# Patient Record
Sex: Female | Born: 1975 | Race: White | Marital: Married | Smoking: Current every day smoker
Health system: Southern US, Community
[De-identification: ages and names within clinical notes are randomized; demographics above are authoritative.]

## PROBLEM LIST (undated history)

## (undated) DIAGNOSIS — G43909 Migraine, unspecified, not intractable, without status migrainosus: Secondary | ICD-10-CM

## (undated) HISTORY — DX: Migraine, unspecified, not intractable, without status migrainosus: G43.909

## (undated) HISTORY — PX: APPENDECTOMY: SHX54

## (undated) HISTORY — PX: OTHER SURGICAL HISTORY: SHX169

---

## 2017-11-01 HISTORY — PX: HYSTEROSCOPY WITH D & C: SHX1775

## 2018-07-24 ENCOUNTER — Encounter: Payer: Self-pay | Admitting: *Deleted

## 2018-08-12 ENCOUNTER — Encounter: Payer: Self-pay | Admitting: Advanced Practice Midwife

## 2018-08-12 NOTE — Progress Notes (Deleted)
Subjective:     Betty Farrell is a 42 y.o. female here for a routine exam.  Current complaints: ***.  Personal health questionnaire reviewed: {yes/no:9010}.   Gynecologic History No LMP recorded. Contraception: {method:5051} Last Pap: ***. Results were: {norm/abn:16337} Last mammogram: ***. Results were: {norm/abn:16337}  Obstetric History OB History  No data available     {Common ambulatory SmartLinks:19316}  Review of Systems {ros; complete:30496}    Objective:    {exam; complete:18323}    Assessment:    Healthy female exam.    Plan:    {plan:19193}

## 2018-10-23 ENCOUNTER — Ambulatory Visit (INDEPENDENT_AMBULATORY_CARE_PROVIDER_SITE_OTHER): Payer: Medicaid Other | Admitting: Obstetrics & Gynecology

## 2018-10-23 ENCOUNTER — Encounter: Payer: Self-pay | Admitting: Obstetrics & Gynecology

## 2018-10-23 VITALS — BP 109/72 | HR 75 | Ht 59.0 in | Wt 127.0 lb

## 2018-10-23 DIAGNOSIS — Z1239 Encounter for other screening for malignant neoplasm of breast: Secondary | ICD-10-CM

## 2018-10-23 DIAGNOSIS — Z30430 Encounter for insertion of intrauterine contraceptive device: Secondary | ICD-10-CM

## 2018-10-23 DIAGNOSIS — Z3009 Encounter for other general counseling and advice on contraception: Secondary | ICD-10-CM

## 2018-10-23 MED ORDER — LEVONORGESTREL 20 MCG/24HR IU IUD
INTRAUTERINE_SYSTEM | Freq: Once | INTRAUTERINE | Status: AC
  Administered 2018-10-23: 1 via INTRAUTERINE

## 2018-10-23 NOTE — Progress Notes (Signed)
GYNECOLOGY CLINIC PROCEDURE NOTE  Betty Farrell is a 43 y.o. G4P4 here with desire for permanent contraception She has been in the [place] for 3 years. She reports that an attempt was made to do a BTL but, failed due to scar tissue. She had bowel surgery years ago due to  some issue with her bowels. She reports that an attempt was made to place an IUD with no success- it fell out in the room before she left 3 times. She does not know who was attempting to place the IUD.     After full discussion of her prior surgeries and review of the risks of the procedure and her alternative the decision was made to proceed with an attempt at LnIUD placement.   IUD Insertion Procedure Note Patient identified, informed consent performed.  Discussed risks of irregular bleeding, cramping, infection, malpositioning or misplacement of the IUD outside the uterus which may require further procedures. Time out was performed.  Urine pregnancy test negative.  Speculum placed in the vagina.  Cervix visualized.  Cleaned with Betadine x 2.  Grasped anteriorly with a single tooth tenaculum.  Uterus sounded to 9 cm.  Mirena IUD placed per manufacturer's recommendations.  Strings trimmed to 3 cm. Tenaculum was removed, good hemostasis noted.  Patient tolerated procedure well.   Patient was given post-procedure instructions.  Patient was asked to follow up in 4 weeks for IUD check and Annual with PAP  In person interpreter was used to the entire visit.   Total face-to-face time with patient was 30 min.  Greater than 50% was spent in counseling and coordination of care with the patient. The visit was prolonged due to language barrier and extended history. Records in [place].  Aadon Gorelik L. Harraway-Smith, M.D., Evern Core

## 2018-10-23 NOTE — Progress Notes (Signed)
[id number] [place]  interpreter used and then live interpreter arrived.  Patient is interested in tubal ligation, but patient has had surgery before on her bowels (was told she couldn't get tubes tied because of abdominal surgery). Armandina Stammer RN

## 2018-10-24 ENCOUNTER — Encounter: Payer: Self-pay | Admitting: Obstetrics & Gynecology

## 2018-11-21 ENCOUNTER — Ambulatory Visit: Payer: Medicaid Other | Admitting: Obstetrics & Gynecology

## 2019-02-19 ENCOUNTER — Ambulatory Visit: Payer: Medicaid Other | Admitting: Family Medicine

## 2019-03-27 ENCOUNTER — Other Ambulatory Visit (HOSPITAL_COMMUNITY)
Admission: RE | Admit: 2019-03-27 | Discharge: 2019-03-27 | Disposition: A | Discharge status: Home / Self Care | Payer: Medicaid Other | Source: Ambulatory Visit | Attending: Obstetrics and Gynecology | Admitting: Obstetrics and Gynecology

## 2019-03-27 ENCOUNTER — Encounter: Payer: Self-pay | Admitting: Obstetrics and Gynecology

## 2019-03-27 ENCOUNTER — Other Ambulatory Visit: Payer: Self-pay

## 2019-03-27 ENCOUNTER — Ambulatory Visit (INDEPENDENT_AMBULATORY_CARE_PROVIDER_SITE_OTHER): Payer: Medicaid Other | Admitting: Obstetrics and Gynecology

## 2019-03-27 VITALS — BP 111/70 | HR 78 | Wt 130.0 lb

## 2019-03-27 DIAGNOSIS — Z789 Other specified health status: Secondary | ICD-10-CM

## 2019-03-27 DIAGNOSIS — Z124 Encounter for screening for malignant neoplasm of cervix: Secondary | ICD-10-CM | POA: Insufficient documentation

## 2019-03-27 DIAGNOSIS — Z3009 Encounter for other general counseling and advice on contraception: Secondary | ICD-10-CM

## 2019-03-27 DIAGNOSIS — Z30432 Encounter for removal of intrauterine contraceptive device: Secondary | ICD-10-CM

## 2019-03-27 NOTE — Progress Notes (Signed)
   IUD Removal Procedure Note   Patient is 43 y.o. G4P4 who is here for Paraguard IUD removal. She would like IUD removed secondary to pain. She understands that she could get pregnant after removal of IUD if she does not use another form of contraception. She has no other complaints today. Reviewed risks of removal including pain, bleeding, difficult removal and inability to remove IUD which may require hysteroscopic removal in OR. She affirms that she would like IUD removed.  BP 111/70   Pulse 78   Wt 130 lb (59 kg)   BMI 26.26 kg/m   Patient with normal appearing external female genitalia. Graves speculum placed in vagina and Paraguard IUD strings easily visualized. Strings grasped with ring forceps and removed easily. Minimal bleeding noted. All instruments removed from vagina. Patient tolerated procedure very well.    She was given post removal instructions. She is planning on using condoms for contraception.   Return 1 year for annual or prn.    Feliz Beam, M.D. Attending Center for [employer] farm manager)

## 2019-03-27 NOTE — Progress Notes (Signed)
   GYNECOLOGY OFFICE FOLLOW UP NOTE  History:  43 y.o. G4P4 here today for follow up for IUD removal. States she has had back pain since it was put in. Also states she just doesn't feel right. Patient will use condoms for contraception. Periods have lighter and irregular. Sometimes they will come and sometimes not.   No pap available in records.   Past Medical History:  Diagnosis Date  . Migraines     Past Surgical History:  Procedure Laterality Date  . APPENDECTOMY    . HYSTEROSCOPY W/D&C  11/01/2017   for irregular bleeding  . kidney stone removal       Current Outpatient Medications:  .  vitamin B-12 (CYANOCOBALAMIN) 100 MCG tablet, Take 100 mcg by mouth daily., Disp: , Rfl:   The following portions of the patient's history were reviewed and updated as appropriate: allergies, current medications, past family history, past medical history, past social history, past surgical history and problem list.   Review of Systems:  Pertinent items noted in HPI and remainder of comprehensive ROS otherwise negative.   Objective:  Physical Exam BP 111/70   Pulse 78   Wt 130 lb (59 kg)   BMI 26.26 kg/m  CONSTITUTIONAL: Well-developed, well-nourished female in no acute distress.  HENT:  Normocephalic, atraumatic. External right and left ear normal. Oropharynx is clear and moist EYES: Conjunctivae and EOM are normal. Pupils are equal, round, and reactive to light. No scleral icterus.  NECK: Normal range of motion, supple, no masses SKIN: Skin is warm and dry. No rash noted. Not diaphoretic. No erythema. No pallor. NEUROLOGIC: Alert and oriented to person, place, and time. Normal reflexes, muscle tone coordination. No cranial nerve deficit noted. PSYCHIATRIC: Normal mood and affect. Normal behavior. Normal judgment and thought content. CARDIOVASCULAR: Normal heart rate noted RESPIRATORY: Effort normal, no problems with respiration noted ABDOMEN: Soft, no distention noted.   PELVIC:  Normal appearing external genitalia; normal appearing vaginal mucosa and cervix.  No abnormal discharge noted.  Pap smear obtained.  MUSCULOSKELETAL: Normal range of motion. No edema noted.  Labs and Imaging No results found.  Assessment & Plan:   1. Language barrier Arabic interpretor used  2. Encounter for IUD removal Patient desires IUD removal. She understands that she may become pregnant, although it is a low risk - see note for removal  3. Encounter for counseling regarding contraception Patient will use condoms, she understands that it is a low risk given her age, but that she could become pregnant. She will use condoms, advised she may buy them at any pharmacy over the counter  4. Pap smear for cervical cancer screening Will call with results   Routine preventative health maintenance measures emphasized. Please refer to After Visit Summary for other counseling recommendations.   Return in about 6 months (around 09/26/2019) for annual.  Total face-to-face time with patient: 20 minutes. Over 50% of encounter was spent on counseling and coordination of care.  Feliz Beam, M.D. Attending Center for [employer] farm manager)

## 2019-03-27 NOTE — Progress Notes (Signed)
Patient states she is having back pain. Patient also states she doesn't have normal period with IUD. Kathrene Alu RN

## 2019-03-31 LAB — CYTOLOGY - PAP
Diagnosis: NEGATIVE
HPV: NOT DETECTED

## 2019-11-27 ENCOUNTER — Emergency Department (HOSPITAL_COMMUNITY): Payer: Medicaid Other

## 2019-11-27 ENCOUNTER — Encounter (HOSPITAL_COMMUNITY): Payer: Self-pay | Admitting: Emergency Medicine

## 2019-11-27 ENCOUNTER — Emergency Department (HOSPITAL_COMMUNITY)
Admission: EM | Admit: 2019-11-27 | Discharge: 2019-11-27 | Disposition: A | Discharge status: Home / Self Care | Payer: Medicaid Other | Attending: Emergency Medicine | Admitting: Emergency Medicine

## 2019-11-27 DIAGNOSIS — Y990 Civilian activity done for income or pay: Secondary | ICD-10-CM | POA: Diagnosis not present

## 2019-11-27 DIAGNOSIS — R42 Dizziness and giddiness: Secondary | ICD-10-CM | POA: Insufficient documentation

## 2019-11-27 DIAGNOSIS — M25552 Pain in left hip: Secondary | ICD-10-CM | POA: Diagnosis not present

## 2019-11-27 DIAGNOSIS — S0083XA Contusion of other part of head, initial encounter: Secondary | ICD-10-CM | POA: Insufficient documentation

## 2019-11-27 DIAGNOSIS — Y9289 Other specified places as the place of occurrence of the external cause: Secondary | ICD-10-CM | POA: Diagnosis not present

## 2019-11-27 DIAGNOSIS — M25512 Pain in left shoulder: Secondary | ICD-10-CM | POA: Insufficient documentation

## 2019-11-27 DIAGNOSIS — Y9389 Activity, other specified: Secondary | ICD-10-CM | POA: Insufficient documentation

## 2019-11-27 DIAGNOSIS — W11XXXA Fall on and from ladder, initial encounter: Secondary | ICD-10-CM | POA: Diagnosis not present

## 2019-11-27 DIAGNOSIS — R519 Headache, unspecified: Secondary | ICD-10-CM | POA: Diagnosis not present

## 2019-11-27 DIAGNOSIS — F17210 Nicotine dependence, cigarettes, uncomplicated: Secondary | ICD-10-CM | POA: Diagnosis not present

## 2019-11-27 DIAGNOSIS — Z79899 Other long term (current) drug therapy: Secondary | ICD-10-CM | POA: Insufficient documentation

## 2019-11-27 DIAGNOSIS — R0789 Other chest pain: Secondary | ICD-10-CM | POA: Diagnosis not present

## 2019-11-27 DIAGNOSIS — T07XXXA Unspecified multiple injuries, initial encounter: Secondary | ICD-10-CM | POA: Diagnosis present

## 2019-11-27 MED ORDER — HYDROCODONE-ACETAMINOPHEN 5-325 MG PO TABS
1.0000 | ORAL_TABLET | Freq: Once | ORAL | Status: AC
  Administered 2019-11-27: 1 via ORAL
  Filled 2019-11-27: qty 1

## 2019-11-27 MED ORDER — HYDROMORPHONE HCL 1 MG/ML IJ SOLN
0.5000 mg | Freq: Once | INTRAMUSCULAR | Status: AC
  Administered 2019-11-27: 0.5 mg via INTRAVENOUS
  Filled 2019-11-27: qty 1

## 2019-11-27 MED ORDER — METHOCARBAMOL 500 MG PO TABS: 500.0000 mg | ORAL_TABLET | Freq: Two times a day (BID) | ORAL | 0 refills | Status: AC

## 2019-11-27 MED ORDER — NAPROXEN 500 MG PO TABS: 500.0000 mg | ORAL_TABLET | Freq: Two times a day (BID) | ORAL | 0 refills | Status: AC

## 2019-11-27 MED ORDER — ONDANSETRON HCL 4 MG/2ML IJ SOLN
4.0000 mg | Freq: Once | INTRAMUSCULAR | Status: AC
  Administered 2019-11-27: 14:00:00 4 mg via INTRAVENOUS
  Filled 2019-11-27: qty 2

## 2019-11-27 NOTE — ED Triage Notes (Signed)
Pt here from work after falling off a ladder approx 6 feet landing on some pallets , no loc , pt is c/o left shoulder pain and face pain and neck pain , pt received fentanyl

## 2019-11-27 NOTE — ED Provider Notes (Signed)
Care assumed from [facility], PA-C at shift change. See her note for full HPI.  In short, patient is a 44 year old female who presents to the ED after a mechanical fall that occurred just prior to arrival. Patient notes she fell from a ladder roughly 6 feet above the ground and landed on some pallets. Patient admits to falling on her left side and hitting the left side of her face. Denies LOC. She admits to pain radiating from left side of her jaw up to her left side cheek/ head and left shoulder. She is not currently on any blood thinners. She also admits to left-sided chest pain.  Plan f/u on CT head and maxillofacial. If normal, patient can be discharged with ibuprofen and muscle relaxer with PCP follow-up.   Physical Exam  BP 140/85   Pulse 85   Temp 98.5 F (36.9 C) (Oral)   Resp 18   SpO2 98%   Physical Exam Vitals and nursing note reviewed.  Constitutional:      General: She is not in acute distress.    Appearance: She is not toxic-appearing.     Comments: Appears uncomfortable in bed  HENT:     Head: Normocephalic.     Comments: Mild ecchymosis and edema overlying left zygomatic arch and left mandible.    Ears:     Comments: No hemotympanum. No battle sign    Nose:     Comments: No septal hematomas bilaterally.    Mouth/Throat:     Comments: Dentition without any injury. No jaw malocclusion. No trismus. No crepitus.  Eyes:     Pupils: Pupils are equal, round, and reactive to light.  Neck:     Comments: No cervical midline tenderness. Full ROM of neck  Cardiovascular:     Rate and Rhythm: Normal rate and regular rhythm.     Pulses: Normal pulses.     Heart sounds: Normal heart sounds. No murmur. No friction rub. No gallop.   Pulmonary:     Effort: Pulmonary effort is normal.     Breath sounds: Normal breath sounds.     Comments: Respirations equal and unlabored, patient able to speak in full sentences, lungs clear to auscultation bilaterally Chest:     Comments:  Tenderness to palpation over left chest wall. No deformity. No crepitus. No ecchymosis.  Abdominal:     General: Abdomen is flat. Bowel sounds are normal. There is no distension.     Palpations: Abdomen is soft.     Tenderness: There is no abdominal tenderness. There is no guarding or rebound.  Musculoskeletal:     Cervical back: Neck supple.     Comments: Tenderness to palpation over left shoulder. No crepitus. No deformity. Limited ROM of shoulder due to pain. Normal left elbow and wrist with full ROM and no tenderness. Neurovascularly intact.  No thoracic or lumbar midline tenderness. Lower extremities neurovascularly intact. Patient able to ambulate in the ED without difficulty.   Skin:    General: Skin is warm and dry.     Capillary Refill: Capillary refill takes less than 2 seconds.  Neurological:     General: No focal deficit present.     Mental Status: She is alert and oriented to person, place, and time.     Cranial Nerves: No cranial nerve deficit.     Motor: No weakness.  Psychiatric:        Mood and Affect: Mood normal.        Behavior: Behavior  normal.     ED Course/Procedures     Procedures  MDM  Care assumed from [facility], [place]. See her note for full MDM.  44 year old female presents to the ED after falling off a ladder 6 feet above the ground and landing on the left side of her body.  Vitals all within normal limits.  Patient in no acute distress and nontoxic-appearing, but appears uncomfortable in bed.  Coworker is at bedside and provided Arabic translation throughout entire encounter. Patient is neurovascularly intact. She is able to ambulate in the ED without difficulty. No signs of basilar skull fracture. No signs of neck or back trauma. No signs of serous head trauma. Normal neurological exam.  X-rays and CT scans personally reviewed which are negative for any acute abnormalities.  Discussed results at length with patient and coworker.  Will discharge patient  with naproxen and Robaxin.  Advised patient that Robaxin can cause drowsiness and do not drive or operate machine around the medication.  Pain controlled here in the ED. Discussed normal muscle soreness with patient.  Instructed patient to follow-up with PCP within the next week for further evaluation. Strict ED precautions discussed with patient. Patient states understanding and agrees to plan. Patient discharged home in no acute distress and stable vitals.    Mannie Stabile, PA-C 11/27/19 1651    Geoffery Lyons, MD 11/27/19 2241

## 2019-11-27 NOTE — Discharge Instructions (Signed)
As discussed, all of your x-rays and CT scans were negative for any broken bones. I am sending you home with a pain medication called Naproxen. You can take it twice a day as needed for pain. Do not mix with other over the counter pain medication. I am also sending you home with a muscle relaxer called Robaxin. Medicine can cause drowsiness, so do not drive or operate machinery while on the medication. Follow-up with your PCP within the next week for further evaluation. Return to the ER for new or worsening symptoms.

## 2019-11-27 NOTE — ED Provider Notes (Signed)
[facility] EMERGENCY DEPARTMENT Provider Note   CSN: [id number] Arrival date & time: 11/27/19  1338     History No chief complaint on file.   Betty Farrell is a 44 y.o. female with history of migraine headaches presents for evaluation of acute onset, persistent left-sided facial pain, left shoulder and left hip pain secondary to mechanical fall just prior to arrival.  Patient is primarily Arabic speaking and a colleague of hers served as Nurse, learning disability at the bedside.  I am also conversational Arabic so we did not require a formal translator.  Her colleague states that she was at work on a ladder approximately 6 feet above the ground when a cord wrapped around her leg causing her to fall off the ladder and landed on the ground on some pallets.  She landed on her left side and struck the left side of her face on the palate.  No loss of consciousness.  She reports pain radiating from the left side of the jaw up to the left side of the head and cheek as well as left shoulder pain which worsens with any movement.  She has some left hip pain but has been ambulatory since the injury without difficulty.  She received 200 mcg of fentanyl via EMS and reports feeling a little lightheaded after this.  She is not on any blood thinners.  She does note some sharp left-sided chest pains with deep inspiration and feels mildly dyspneic but denies abdominal pain, nausea, or vomiting.  The history is provided by the patient and a friend.       Past Medical History:  Diagnosis Date  . Migraines     There are no problems to display for this patient.   Past Surgical History:  Procedure Laterality Date  . APPENDECTOMY    . HYSTEROSCOPY WITH D & C  11/01/2017   for irregular bleeding  . kidney stone removal       OB History    Gravida  4   Para  4   Term      Preterm      AB      Living  1     SAB      TAB      Ectopic      Multiple  1   Live Births  5            Family History  Problem Relation Age of Onset  . Diabetes Mother   . Hypertension Mother   . Cancer Neg Hx     Social History   Tobacco Use  . Smoking status: Current Every Day Smoker    Packs/day: 0.50    Types: Cigarettes  . Smokeless tobacco: Never Used  Substance Use Topics  . Alcohol use: Not Currently  . Drug use: Not Currently    Home Medications Prior to Admission medications   Medication Sig Start Date End Date Taking? Authorizing Provider  vitamin B-12 (CYANOCOBALAMIN) 100 MCG tablet Take 100 mcg by mouth daily.    [provider]    Allergies    Patient has no known allergies.  Review of Systems   Review of Systems  Constitutional: Negative for chills and fever.  Eyes: Negative for photophobia and visual disturbance.  Respiratory: Negative for shortness of breath.   Cardiovascular: Negative for chest pain.  Gastrointestinal: Negative for abdominal pain, nausea and vomiting.  Musculoskeletal: Positive for arthralgias.  Neurological: Positive for light-headedness and headaches. Negative for syncope.  All other systems reviewed and are negative.   Physical Exam Updated Vital Signs BP 112/84   Pulse 75   Temp 98.5 F (36.9 C) (Oral)   Resp 18   SpO2 99%   Physical Exam Vitals and nursing note reviewed.  Constitutional:      General: She is not in acute distress.    Appearance: She is well-developed.  HENT:     Head: Normocephalic.     Comments: Mild ecchymosis and swelling overlying the left zygomatic arch and left mandible.  Dentition is stable.  No jaw malocclusion.  No battle signs, raccoon eyes or rhinorrhea.  No hemotympanum bilaterally. Eyes:     General:        Right eye: No discharge.        Left eye: No discharge.     Extraocular Movements: Extraocular movements intact.     Conjunctiva/sclera: Conjunctivae normal.     Pupils: Pupils are equal, round, and reactive to light.  Neck:     Vascular: No JVD.     Trachea: No  tracheal deviation.     Comments: No midline cervical spine tenderness.  No deformity, crepitus, or step-off.  Mild left paracervical muscle tenderness in the trapezius distribution Cardiovascular:     Rate and Rhythm: Normal rate.  Pulmonary:     Effort: Pulmonary effort is normal.     Breath sounds: Normal breath sounds.       Comments: Left lateral chest wall tenderness with no deformity, crepitus, ecchymosis or flail segment Chest:     Chest wall: Tenderness present.  Abdominal:     General: Abdomen is flat. There is no distension.     Palpations: Abdomen is soft.     Tenderness: There is no abdominal tenderness. There is no guarding or rebound.  Musculoskeletal:        General: Tenderness present.     Cervical back: Neck supple.     Comments: Diffuse tenderness to palpation of the left shoulder and left lateral hip.  Normal passive range of motion of the left hip but with pain with flexion and external rotation.  5/5 strength of BUE and BLE major muscle groups though pain limits examination somewhat.  Skin:    General: Skin is warm and dry.     Findings: No erythema.  Neurological:     Mental Status: She is alert.     Comments: Fluent speech with no evidence of dysarthria or aphasia.  No facial droop.  Sensation intact to light touch of bilateral upper and lower extremities.  Psychiatric:        Behavior: Behavior normal.     ED Results / Procedures / Treatments   Labs (all labs ordered are listed, but only abnormal results are displayed) Labs Reviewed - No data to display  EKG None  Radiology DG Ribs Unilateral W/Chest Left  Result Date: 11/27/2019 CLINICAL DATA:  Status post fall with pain of left ribs. EXAM: LEFT RIBS AND CHEST - 3+ VIEW COMPARISON:  Chest x-ray December 23, 2018 FINDINGS: No fracture or other bone lesions are seen involving the ribs. There is no evidence of pneumothorax or pleural effusion. Both lungs are clear. Heart size and mediastinal contours are  within normal limits. IMPRESSION: Negative. Electronically Signed   By: Abelardo Diesel M.D.   On: 11/27/2019 14:35   DG Shoulder Left  Result Date: 11/27/2019 CLINICAL DATA:  Status post fall with pain of left shoulder. EXAM: LEFT SHOULDER - 2+ VIEW COMPARISON:  None. FINDINGS: There is no evidence of fracture or dislocation. There is no evidence of arthropathy or other focal bone abnormality. Soft tissues are unremarkable. IMPRESSION: Negative. Electronically Signed   By: Sherian Rein M.D.   On: 11/27/2019 14:36   DG Hip Unilat With Pelvis 2-3 Views Left  Result Date: 11/27/2019 CLINICAL DATA:  Status post fall with pain of left hip. EXAM: DG HIP (WITH OR WITHOUT PELVIS) 2-3V LEFT COMPARISON:  None. FINDINGS: There is no evidence of hip fracture or dislocation. There is no evidence of arthropathy or other focal bone abnormality. IMPRESSION: Negative. Electronically Signed   By: Sherian Rein M.D.   On: 11/27/2019 14:34    Procedures Procedures (including critical care time)  Medications Ordered in ED Medications  HYDROmorphone (DILAUDID) injection 0.5 mg (0.5 mg Intravenous Given 11/27/19 1413)  ondansetron (ZOFRAN) injection 4 mg (4 mg Intravenous Given 11/27/19 1413)    ED Course  I have reviewed the triage vital signs and the nursing notes.  Pertinent labs & imaging results that were available during my care of the patient were reviewed by me and considered in my medical decision making (see chart for details).    MDM Rules/Calculators/A&P                      Patient presenting for evaluation after mechanical fall off of a ladder.  She is afebrile, vital signs are stable.  She is nontoxic in appearance.  She is neurovascularly intact.  Mild ecchymosis to the left side of the face but no jaw malocclusion or signs of serious head trauma.  No loss of consciousness.  No midline spine tenderness.  She has tenderness to palpation of the left shoulder, left side of the chest wall, and left  hip but she has been ambulatory since the fall without difficulty.  Will give pain medicine and obtain imaging and reassess.  Radiographs reviewed and interpreted by myself show no evidence of acute osseous abnormality, no evidence of pneumothorax or rib fracture.  I agree with radiology impression.  3:36 PM   Signed out care to oncoming provider PA Aberman.  Pending CT of the head and max face to rule out intracranial abnormality, skull fracture or facial fracture.  If results are reassuring and she is ambulatory in the ED then she will likely be stable for discharge home with conservative therapy and management of her symptoms.  Alternatively if serious findings noted may require admission to the hospital or consult to trauma surgery or neurosurgery.  Final Clinical Impression(s) / ED Diagnoses Final diagnoses:  Accidental fall from ladder, initial encounter  Left facial pain  Acute pain of left shoulder  Acute chest wall pain  Acute pain of left hip    Rx / DC Orders ED Discharge Orders    None       Bennye Alm 11/27/19 1537    Benjiman Core, MD 11/27/19 1546

## 2021-05-19 IMAGING — CT CT MAXILLOFACIAL W/O CM
3 series · 16 of 47 positions shown, 19 images · non-contrast
Comparison: CT head July 24, 2017

CLINICAL DATA: Facial trauma with headaches.

EXAM:
CT HEAD WITHOUT CONTRAST
CT MAXILLOFACIAL WITHOUT CONTRAST
TECHNIQUE: Multidetector CT imaging of the head and maxillofacial structures
were performed using the standard protocol without intravenous
contrast. Multiplanar CT image reconstructions of the maxillofacial
structures were also generated.

[Series 3: facial/ orbits 2.0 h30s · axial · 0.34mm/px · z∈[-163,-39]mm · 10 of 74 slices shown, 13 images]
[im 6/74  brain]
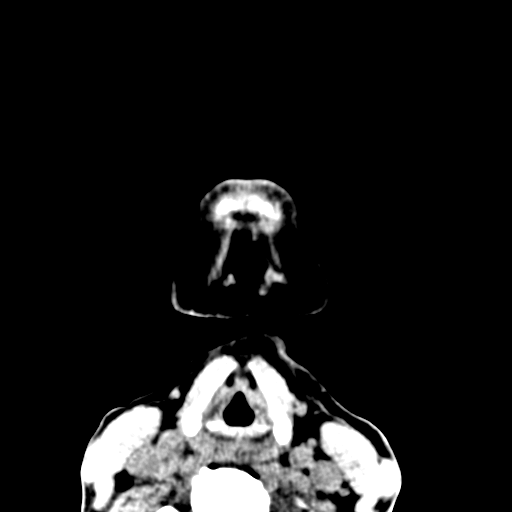
[im 6/74  bone]
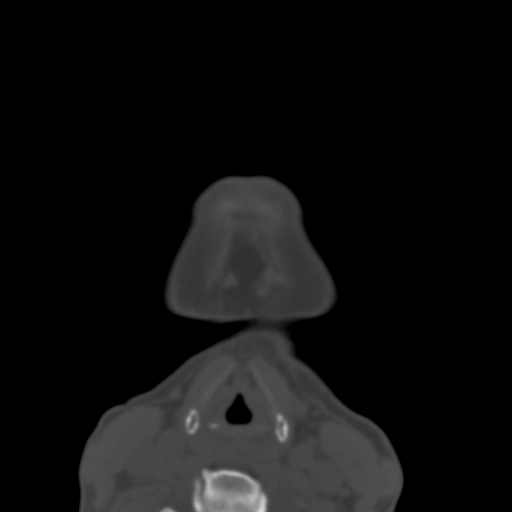
[im 13/74  bone]
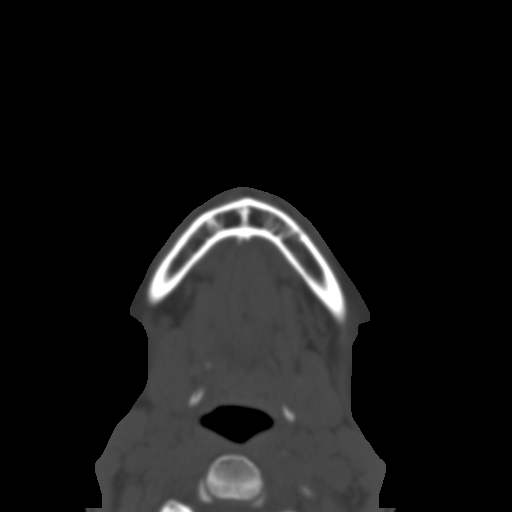
[im 21/74  bone]
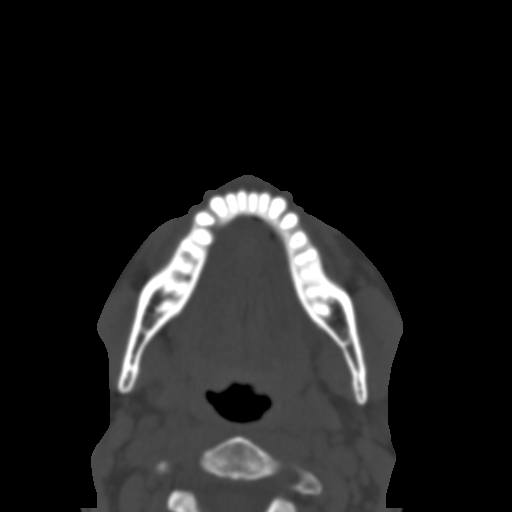
[im 26/74  bone]
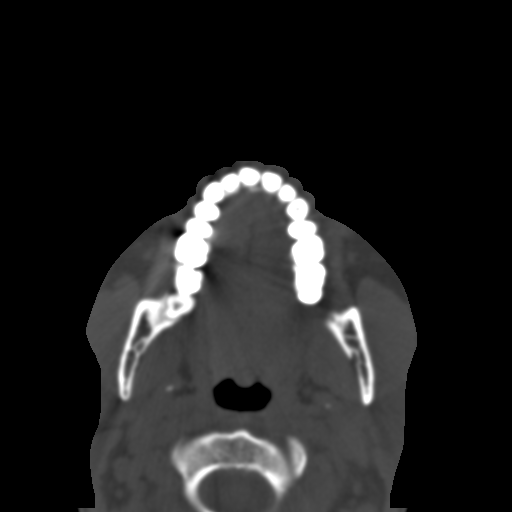
[im 33/74  brain]
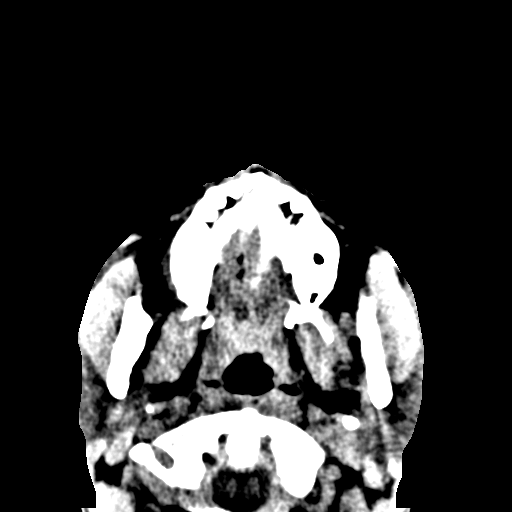
[im 33/74  bone]
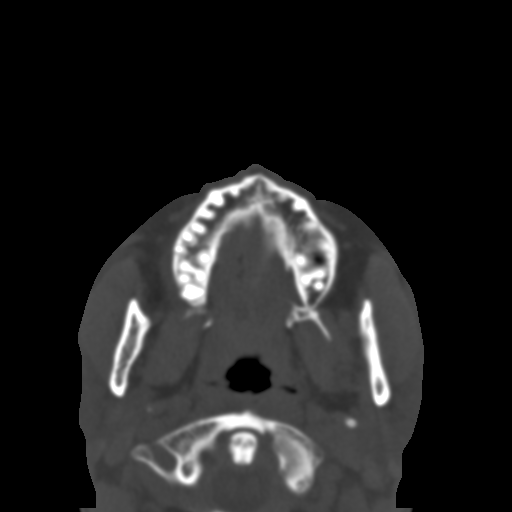
[im 41/74  bone]
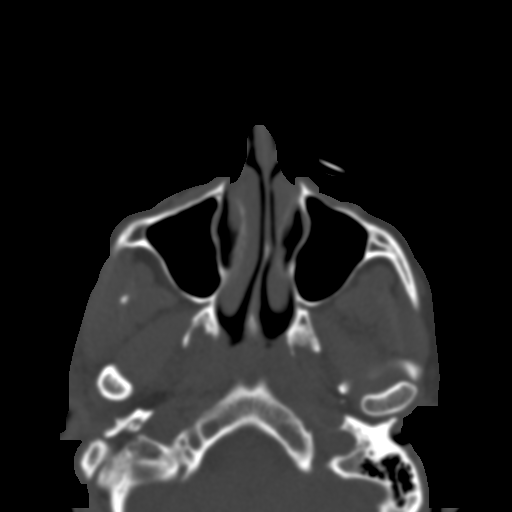
[im 48/74  bone]
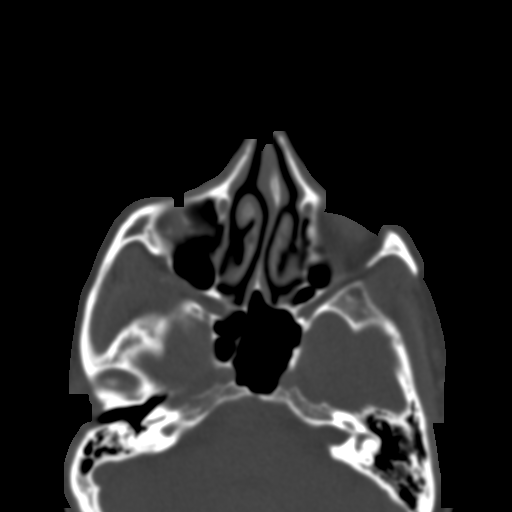
[im 56/74  bone]
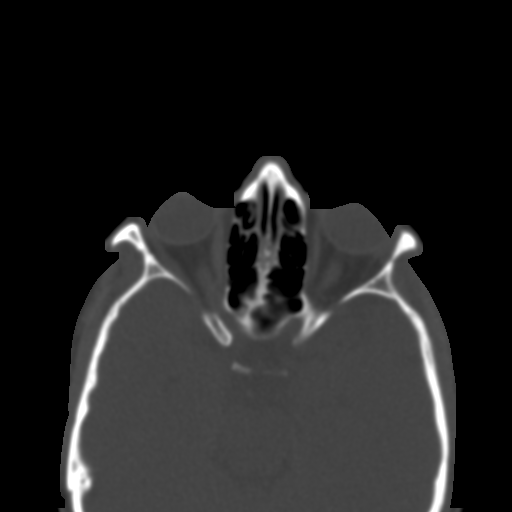
[im 61/74  brain]
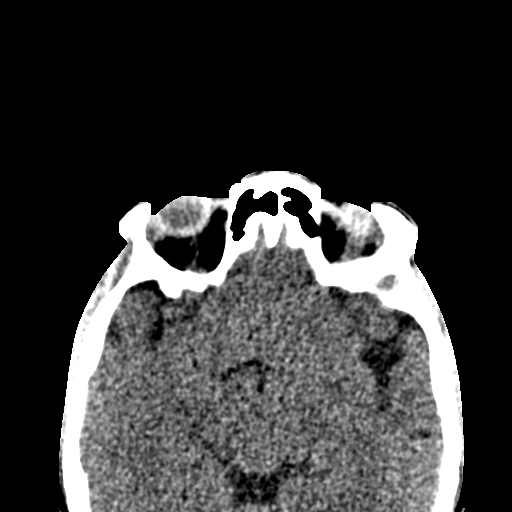
[im 61/74  bone]
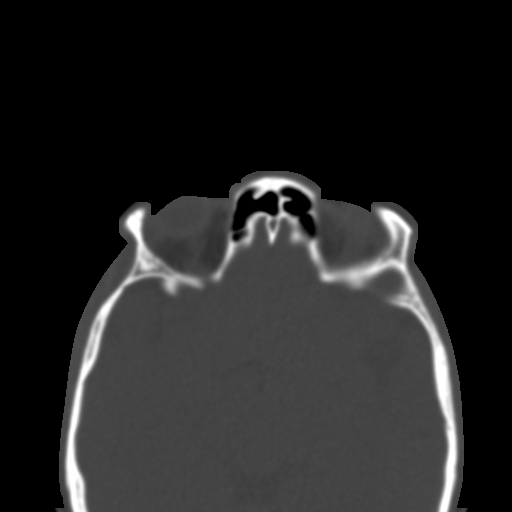
[im 68/74  bone]
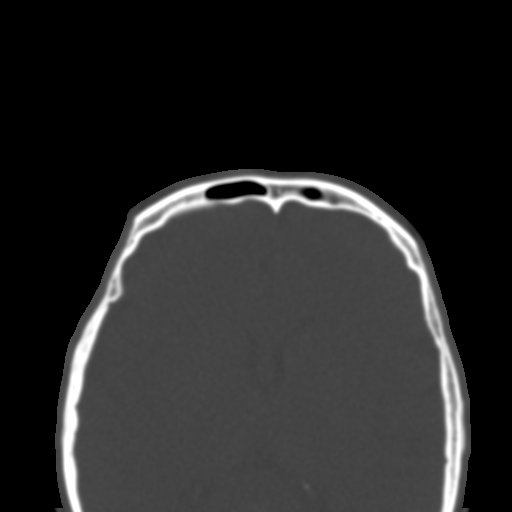

[Series 7: coronal soft tissue · coronal · 0.34mm/px · 3 of 80 slices shown]
[im 27/80  bone]
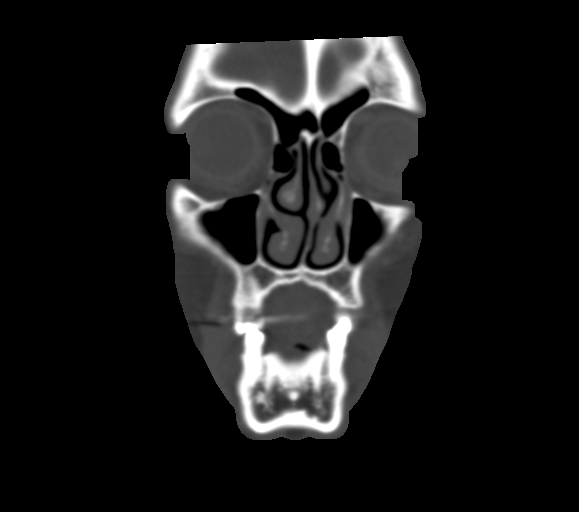
[im 36/80  bone]
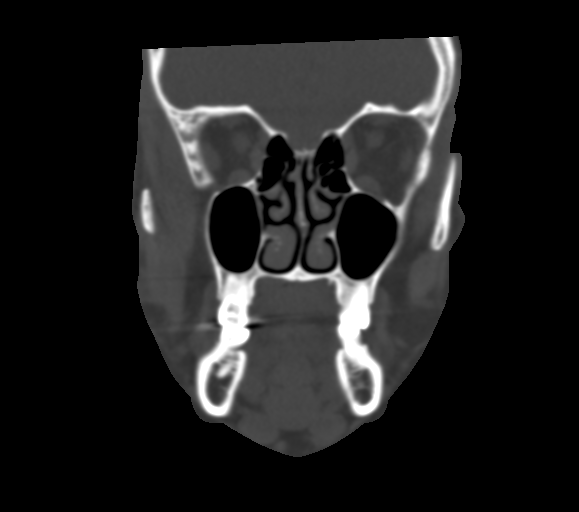
[im 44/80  bone]
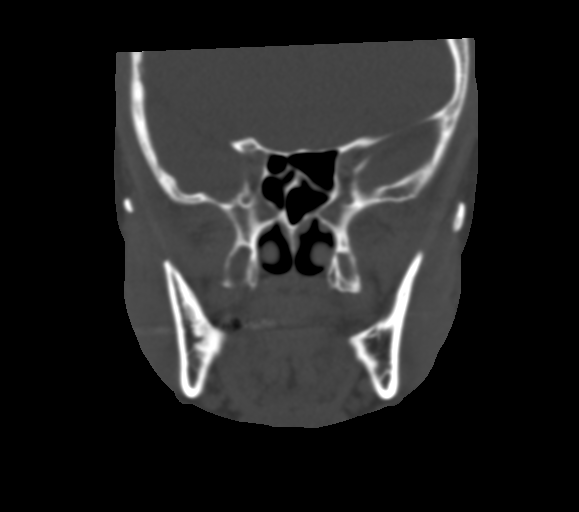

[Series 8: sagittal soft tissue · sagittal · 0.30mm/px · 3 of 85 slices shown]
[im 29/85  bone]
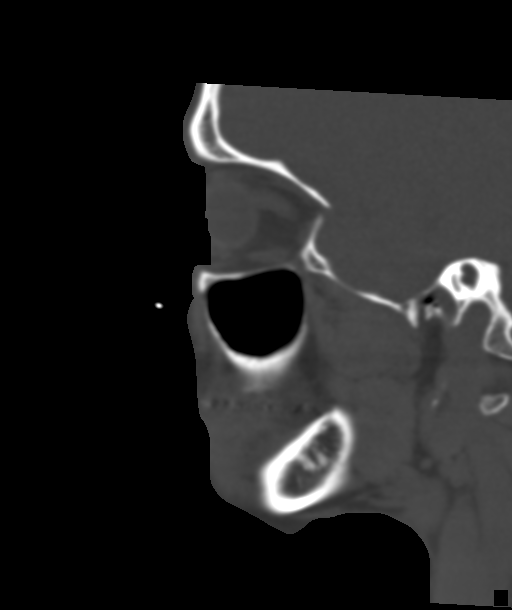
[im 43/85  bone]
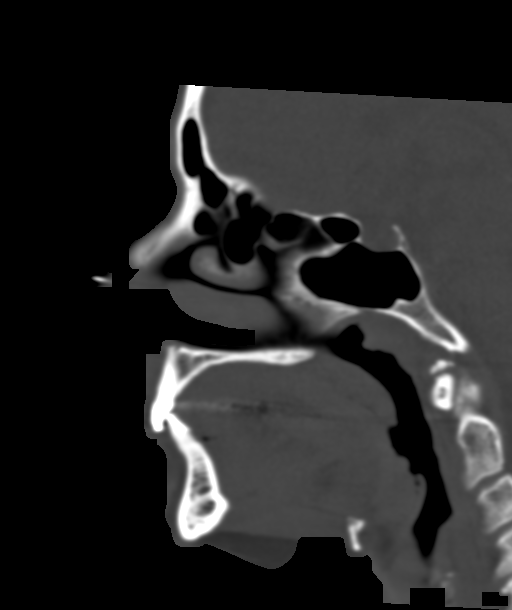
[im 57/85  bone]
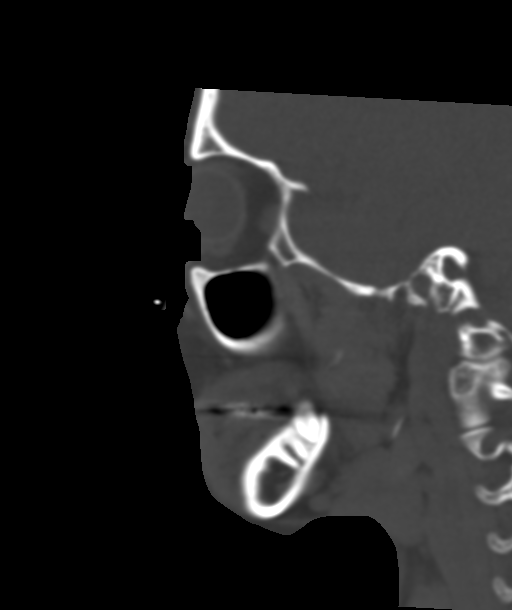

[16 of 47 positions shown; findings below may reference images not displayed]

FINDINGS: CT HEAD FINDINGS

Brain: No evidence of acute infarction, hemorrhage, hydrocephalus,
extra-axial collection or mass lesion/mass effect.

Vascular: No hyperdense vessel noted.

Skull: Normal. Negative for fracture or focal lesion.

Other: None.

CT MAXILLOFACIAL FINDINGS

Osseous: No fracture or mandibular dislocation. No destructive
process.

Orbits: Negative. No traumatic or inflammatory finding.

Sinuses: Clear.

Soft tissues: Negative.
IMPRESSION: 1. No focal acute intracranial abnormality identified.
2. No acute fracture or dislocation of maxillofacial bones.
